# Patient Record
Sex: Female | Born: 1960 | Race: White | Hispanic: No | Marital: Single | Smoking: Never smoker
Health system: Southern US, Community
[De-identification: ages and names within clinical notes are randomized; demographics above are authoritative.]

## PROBLEM LIST (undated history)

## (undated) DIAGNOSIS — E039 Hypothyroidism, unspecified: Secondary | ICD-10-CM

## (undated) DIAGNOSIS — T8859XA Other complications of anesthesia, initial encounter: Secondary | ICD-10-CM

## (undated) DIAGNOSIS — R519 Headache, unspecified: Secondary | ICD-10-CM

## (undated) HISTORY — PX: AUGMENTATION MAMMAPLASTY: SUR837

## (undated) HISTORY — PX: OVARY SURGERY: SHX727

## (undated) HISTORY — PX: BREAST ENHANCEMENT SURGERY: SHX7

---

## 2005-01-09 ENCOUNTER — Ambulatory Visit: Payer: Self-pay | Admitting: Family Medicine

## 2005-02-20 ENCOUNTER — Ambulatory Visit: Payer: Self-pay | Admitting: Family Medicine

## 2007-04-08 ENCOUNTER — Ambulatory Visit: Payer: Self-pay | Admitting: Allergy

## 2008-02-06 ENCOUNTER — Emergency Department: Payer: Self-pay | Admitting: Emergency Medicine

## 2008-06-21 ENCOUNTER — Ambulatory Visit: Payer: Self-pay | Admitting: Physical Medicine and Rehabilitation

## 2009-03-15 ENCOUNTER — Ambulatory Visit: Payer: Self-pay | Admitting: Internal Medicine

## 2010-01-16 ENCOUNTER — Ambulatory Visit: Payer: Self-pay

## 2013-07-04 ENCOUNTER — Ambulatory Visit: Payer: Self-pay | Admitting: Family Medicine

## 2014-11-09 ENCOUNTER — Encounter: Payer: Self-pay | Admitting: Family Medicine

## 2014-11-24 ENCOUNTER — Encounter: Payer: Self-pay | Admitting: Family Medicine

## 2014-12-25 ENCOUNTER — Encounter: Payer: Self-pay | Admitting: Family Medicine

## 2015-01-23 ENCOUNTER — Encounter
Admit: 2015-01-23 | Disposition: A | Discharge status: Home / Self Care | Payer: Self-pay | Attending: Family Medicine | Admitting: Family Medicine

## 2015-02-13 IMAGING — CR DG LUMBAR SPINE COMPLETE W/ BEND
1 series · 8 of 9 positions shown · non-contrast
Comparison: none

REASON FOR EXAM: back pain
COMMENTS:

[Series 1: ap · 0.17mm/px · 8 of 9 slices shown]
[im 1/9]
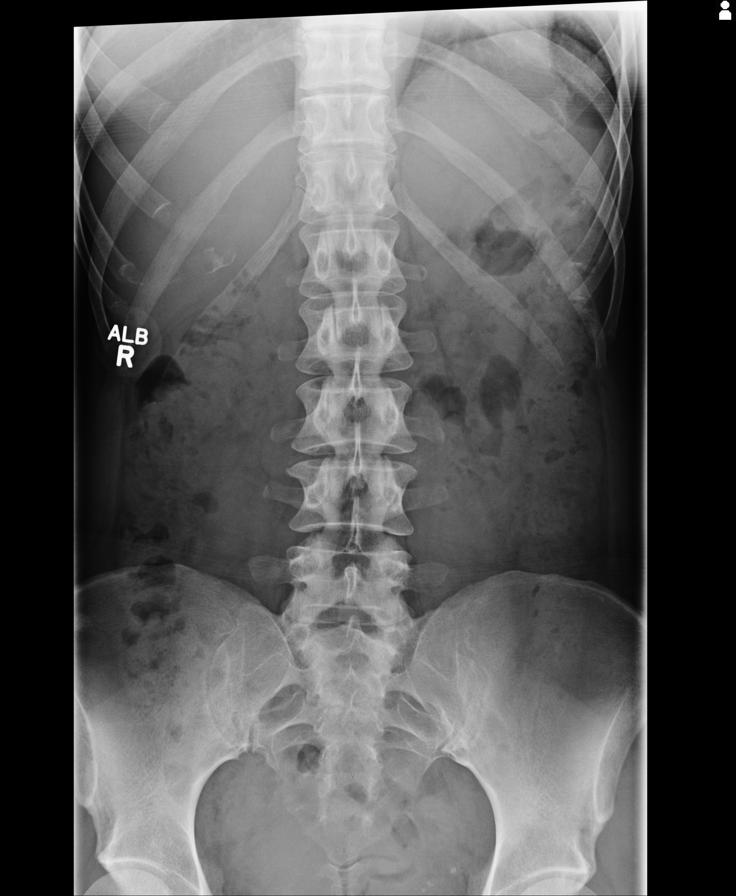
[im 2/9]
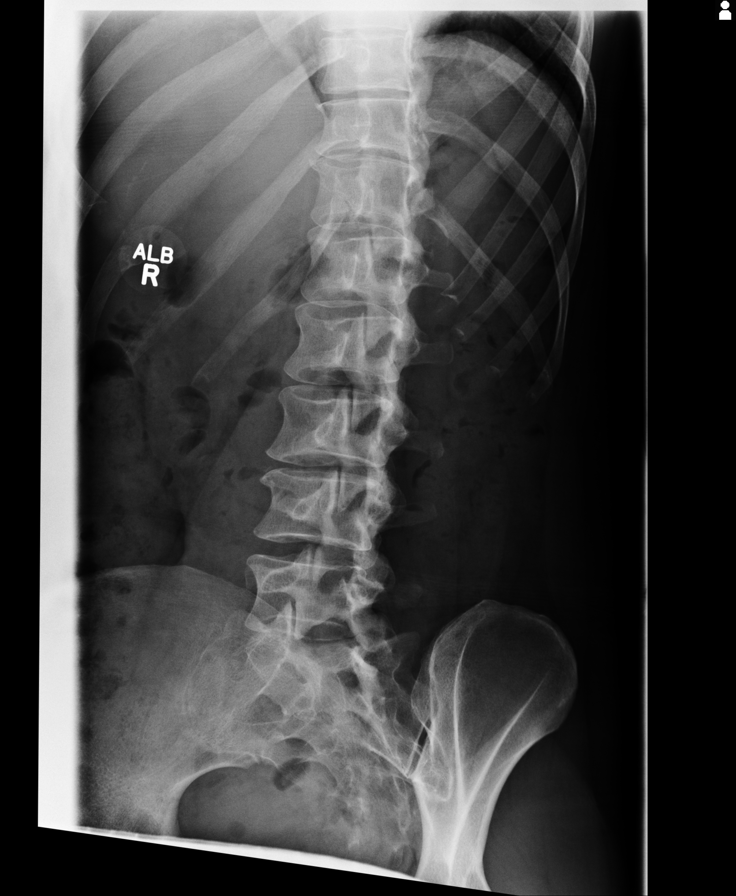
[im 3/9]
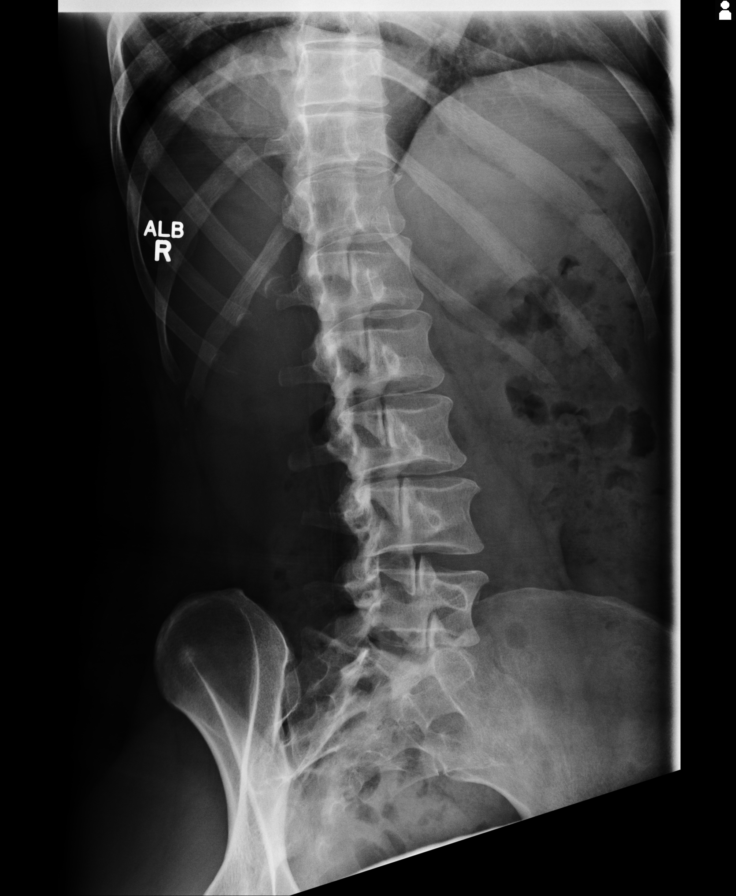
[im 4/9]
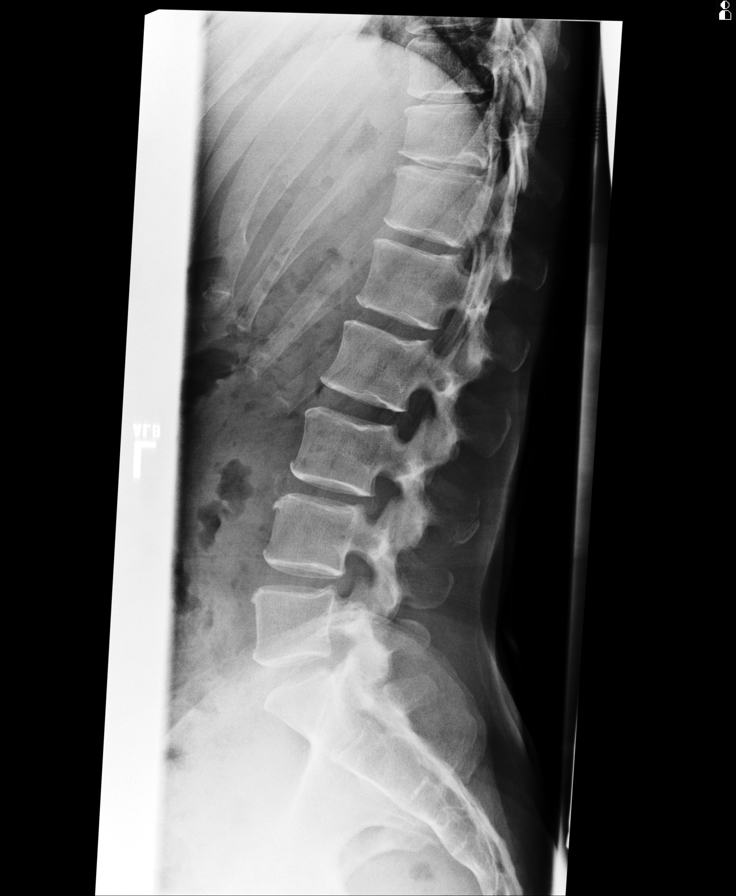
[im 5/9]
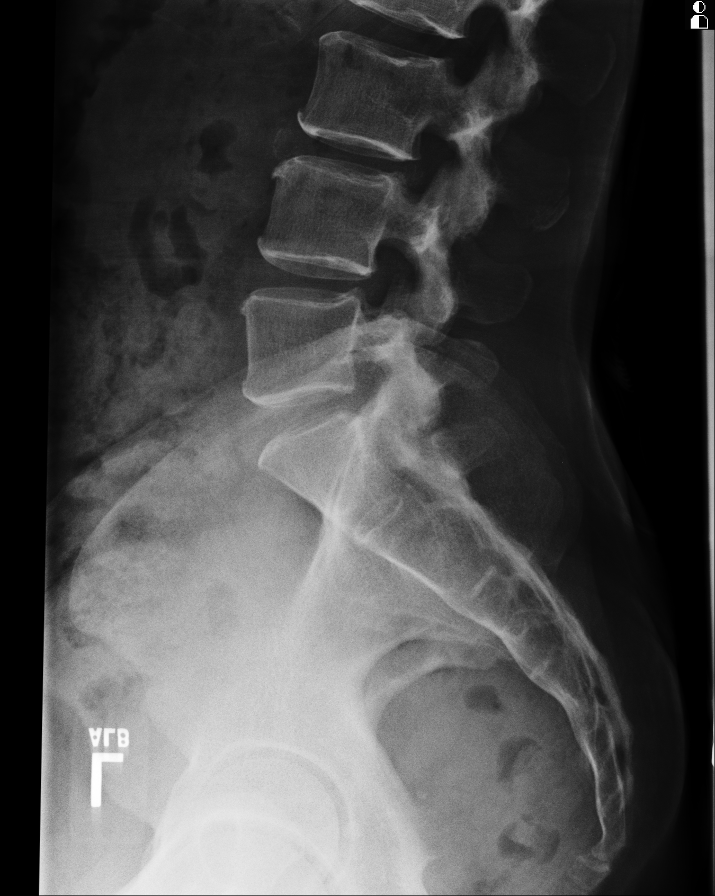
[im 6/9]
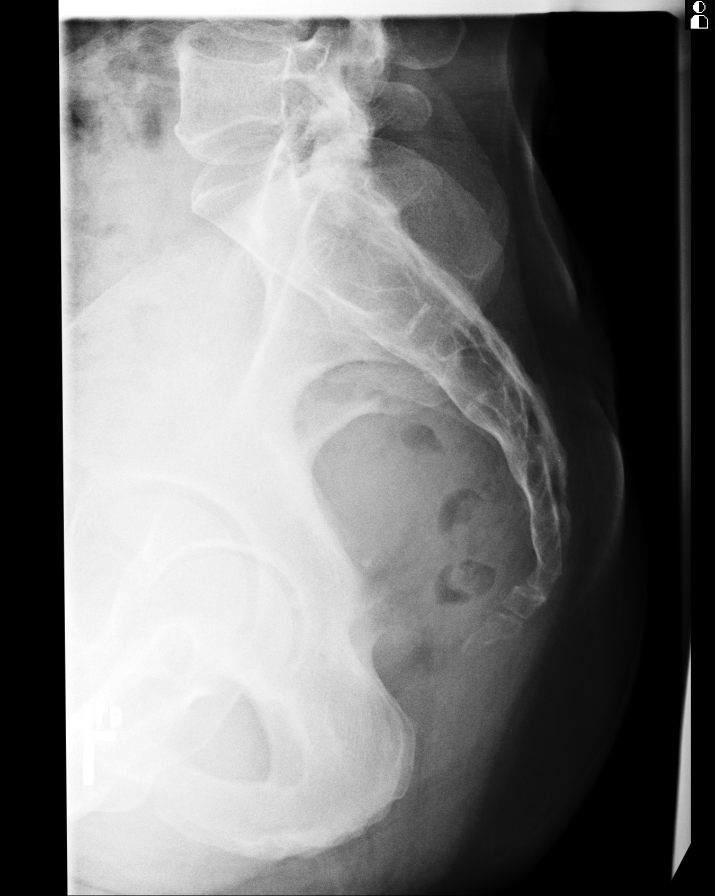
[im 7/9]
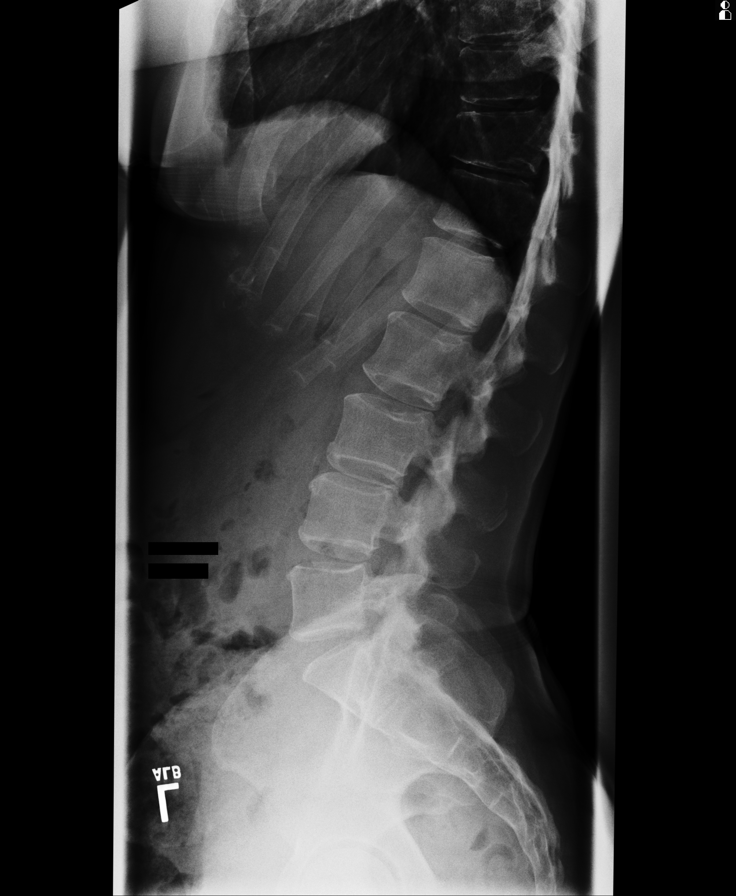
[im 8/9]
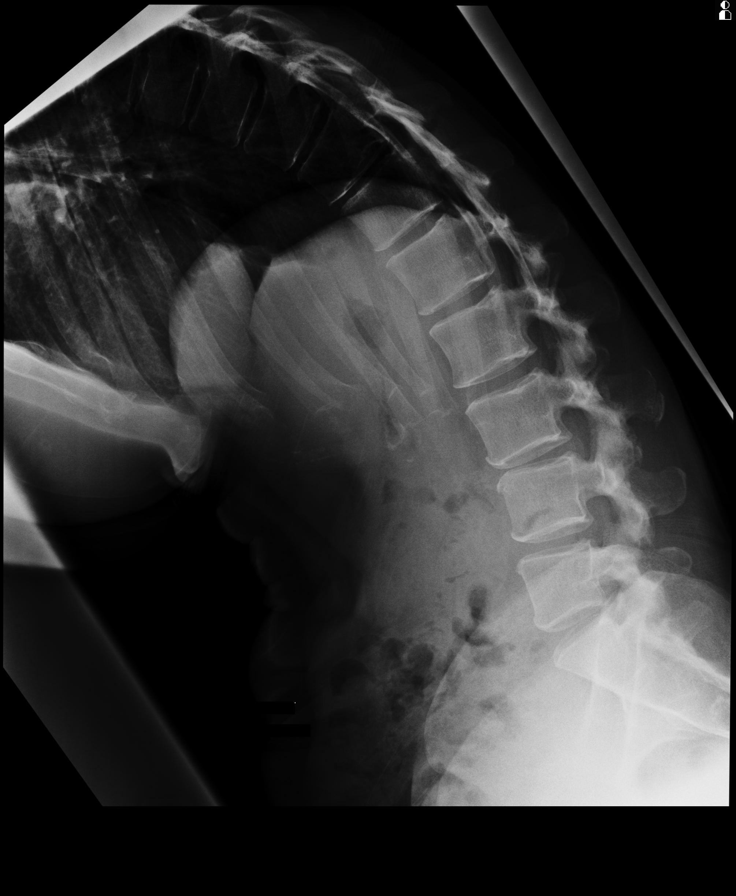

[8 of 9 positions shown; findings below may reference images not displayed]

PROCEDURE:     MDR - MDR LSP COMPLETE W/FLEX/EXTEN  - July 04, 2013 [DATE]

RESULT:     Lumbar spine alignment is maintained. The facets are normally
aligned. The vertebral body heights and intervertebral disc spaces appear to
be normal. No fracture is evident. Minimal scattered atherosclerotic
calcification appears present in the aorta. Erect neutral, flexion and
extension views of the lumbar spine do not show evidence of instability.
There appears to be good range of motion with flexion and extension.
IMPRESSION: 1. No significant degenerative disease.
2. No evidence of instability.
3. No acute bony abnormality.

[REDACTED]

## 2015-02-23 ENCOUNTER — Encounter
Admit: 2015-02-23 | Disposition: A | Discharge status: Home / Self Care | Payer: Self-pay | Attending: Family Medicine | Admitting: Family Medicine

## 2015-10-23 ENCOUNTER — Other Ambulatory Visit: Payer: Self-pay | Admitting: Obstetrics and Gynecology

## 2015-10-23 DIAGNOSIS — Z1231 Encounter for screening mammogram for malignant neoplasm of breast: Secondary | ICD-10-CM

## 2016-03-05 ENCOUNTER — Other Ambulatory Visit: Payer: Self-pay | Admitting: Obstetrics and Gynecology

## 2016-03-05 DIAGNOSIS — Z1231 Encounter for screening mammogram for malignant neoplasm of breast: Secondary | ICD-10-CM

## 2017-10-27 ENCOUNTER — Other Ambulatory Visit: Payer: Self-pay | Admitting: Obstetrics and Gynecology

## 2017-10-27 DIAGNOSIS — Z1231 Encounter for screening mammogram for malignant neoplasm of breast: Secondary | ICD-10-CM

## 2017-12-30 ENCOUNTER — Other Ambulatory Visit: Payer: Self-pay | Admitting: Family Medicine

## 2017-12-30 DIAGNOSIS — Z1231 Encounter for screening mammogram for malignant neoplasm of breast: Secondary | ICD-10-CM

## 2017-12-30 DIAGNOSIS — Z9882 Breast implant status: Secondary | ICD-10-CM

## 2018-02-24 ENCOUNTER — Inpatient Hospital Stay: Admission: RE | Admit: 2018-02-24 | Payer: Self-pay | Source: Ambulatory Visit

## 2018-03-10 ENCOUNTER — Inpatient Hospital Stay: Admission: RE | Admit: 2018-03-10 | Payer: Self-pay | Source: Ambulatory Visit

## 2018-03-22 ENCOUNTER — Inpatient Hospital Stay: Admission: RE | Admit: 2018-03-22 | Payer: Self-pay | Source: Ambulatory Visit

## 2018-11-11 ENCOUNTER — Other Ambulatory Visit: Payer: Self-pay | Admitting: Obstetrics and Gynecology

## 2018-11-11 DIAGNOSIS — Z1231 Encounter for screening mammogram for malignant neoplasm of breast: Secondary | ICD-10-CM

## 2022-01-28 ENCOUNTER — Other Ambulatory Visit: Payer: Self-pay | Admitting: Obstetrics and Gynecology

## 2022-01-28 DIAGNOSIS — Z1231 Encounter for screening mammogram for malignant neoplasm of breast: Secondary | ICD-10-CM

## 2022-04-03 ENCOUNTER — Ambulatory Visit
Admission: EM | Admit: 2022-04-03 | Discharge: 2022-04-03 | Disposition: A | Discharge status: Home / Self Care | Payer: BC Managed Care – PPO | Attending: Emergency Medicine | Admitting: Emergency Medicine

## 2022-04-03 ENCOUNTER — Encounter: Payer: Self-pay | Admitting: Emergency Medicine

## 2022-04-03 DIAGNOSIS — G43111 Migraine with aura, intractable, with status migrainosus: Secondary | ICD-10-CM | POA: Diagnosis not present

## 2022-04-03 MED ORDER — ONDANSETRON HCL 4 MG/2ML IJ SOLN
8.0000 mg | Freq: Once | INTRAMUSCULAR | Status: AC
  Administered 2022-04-03: 8 mg via INTRAVENOUS

## 2022-04-03 MED ORDER — DIPHENHYDRAMINE HCL 50 MG/ML IJ SOLN
25.0000 mg | Freq: Once | INTRAMUSCULAR | Status: AC
  Administered 2022-04-03: 25 mg via INTRAVENOUS

## 2022-04-03 MED ORDER — METHYLPREDNISOLONE 4 MG PO TBPK: ORAL_TABLET | ORAL | 0 refills | Status: DC

## 2022-04-03 MED ORDER — KETOROLAC TROMETHAMINE 30 MG/ML IJ SOLN
30.0000 mg | Freq: Once | INTRAMUSCULAR | Status: AC
  Administered 2022-04-03: 30 mg via INTRAVENOUS

## 2022-04-03 MED ORDER — SODIUM CHLORIDE 0.9 % IV BOLUS
1000.0000 mL | Freq: Once | INTRAVENOUS | Status: AC
  Administered 2022-04-03: 1000 mL via INTRAVENOUS

## 2022-04-03 NOTE — ED Provider Notes (Signed)
?[facility] ? ? ? ?CSN: [id number] ?Arrival date & time: 04/03/22  0803 ? ? ?  ? ?History   ?Chief Complaint ?Chief Complaint  ?Patient presents with  ? Migraine  ? Emesis  ? ? ?HPI ?Michele Ritter is a 61 y.o. female.  ? ?HPI ? ?61 year old female here for evaluation of migraine headache. ? ?Patient reports that she has been experiencing her current migraine for the last 4 days.  She states that for the first 2 days was on the right side of her scalp and then switched to the left side.  She states that this is typical pattern for her migraine.  She occasionally experiences an aura and she had 1 this morning but has not had 1 in the previous 3 days.  She is also complaining of dizziness, which sometimes also accompanies her migraines.  She denies any changes in vision or numbness and tingling in her extremities.  She did begin vomiting this morning and has had several episodes of emesis.  She had been seen by Dr. Lennette Bihari at the [facility] but she is no longer going there.  She has experimented with 2 rounds of Botox for her migraines as well as lidocaine and steroid injections that were unsuccessful.  She states that she is never been in the ER and she is unaware of getting steroids for her migraines as she has an intolerance because they cause tachycardia.  Patient reports that her migraine headache cycle is 3 days on followed by 3-day break and then another 3 days of headache.  This has been a cycle that has been ongoing for a long while.  Sometimes her headaches last 4 days but that is unusual. ? ?History reviewed. No pertinent past medical history. ? ?There are no problems to display for this patient. ? ? ?History reviewed. No pertinent surgical history. ? ?OB History   ?No obstetric history on file. ?  ? ? ? ?Home Medications   ? ?Prior to Admission medications   ?Medication Sig Start Date End Date Taking? Authorizing Provider  ?methylPREDNISolone (MEDROL DOSEPAK) 4 MG TBPK tablet Take  according to the package insert. 04/03/22  Yes Becky Augusta, NP  ? ? ?Family History ?History reviewed. No pertinent family history. ? ?Social History ?Social History  ? ?Tobacco Use  ? Smoking status: Never  ? Smokeless tobacco: Never  ? ? ? ?Allergies   ?Codeine and Dust mite extract ? ? ?Review of Systems ?Review of Systems  ?Eyes:  Negative for visual disturbance.  ?Gastrointestinal:  Positive for nausea and vomiting.  ?Neurological:  Positive for dizziness and headaches. Negative for syncope and facial asymmetry.  ?Hematological: Negative.   ?Psychiatric/Behavioral: Negative.    ? ? ?Physical Exam ?Triage Vital Signs ?ED Triage Vitals  ?Enc Vitals Group  ?   BP 04/03/22 0816 (!) 154/90  ?   Pulse Rate 04/03/22 0816 61  ?   Resp 04/03/22 0816 16  ?   Temp 04/03/22 0816 97.7 ?F (36.5 ?C)  ?   Temp Source 04/03/22 0816 Oral  ?   SpO2 04/03/22 0816 100 %  ?   Weight --   ?   Height --   ?   Head Circumference --   ?   Peak Flow --   ?   Pain Score 04/03/22 0813 10  ?   Pain Loc --   ?   Pain Edu? --   ?   Excl. in GC? --   ? ?  No data found. ? ?Updated Vital Signs ?BP (!) 154/90 (BP Location: Right Arm)   Pulse 61   Temp 97.7 ?F (36.5 ?C) (Oral)   Resp 16   SpO2 100%  ? ?Visual Acuity ?Right Eye Distance:   ?Left Eye Distance:   ?Bilateral Distance:   ? ?Right Eye Near:   ?Left Eye Near:    ?Bilateral Near:    ? ?Physical Exam ?Vitals and nursing note reviewed.  ?Constitutional:   ?   General: She is in acute distress.  ?   Appearance: Normal appearance.  ?HENT:  ?   Head: Normocephalic and atraumatic.  ?   Mouth/Throat:  ?   Mouth: Mucous membranes are moist.  ?   Pharynx: Oropharynx is clear. No oropharyngeal exudate or posterior oropharyngeal erythema.  ?Eyes:  ?   General:     ?   Right eye: No discharge.     ?   Left eye: No discharge.  ?   Extraocular Movements: Extraocular movements intact.  ?   Conjunctiva/sclera: Conjunctivae normal.  ?   Pupils: Pupils are equal, round, and reactive to light.   ?Musculoskeletal:  ?   Cervical back: Normal range of motion and neck supple. Tenderness present.  ?Skin: ?   General: Skin is warm and dry.  ?   Capillary Refill: Capillary refill takes less than 2 seconds.  ?   Findings: No erythema or rash.  ?Neurological:  ?   General: No focal deficit present.  ?   Mental Status: She is alert and oriented to person, place, and time.  ?   Cranial Nerves: No cranial nerve deficit.  ?   Sensory: No sensory deficit.  ?   Motor: No weakness.  ?Psychiatric:     ?   Mood and Affect: Mood normal.     ?   Behavior: Behavior normal.     ?   Thought Content: Thought content normal.     ?   Judgment: Judgment normal.  ? ? ? ?UC Treatments / Results  ?Labs ?(all labs ordered are listed, but only abnormal results are displayed) ?Labs Reviewed - No data to display ? ?EKG ? ? ?Radiology ?No results found. ? ?Procedures ?Procedures (including critical care time) ? ?Medications Ordered in UC ?Medications  ?sodium chloride 0.9 % bolus 1,000 mL (1,000 mLs Intravenous New Bag/Given 04/03/22 0851)  ?ondansetron [facility]) injection 8 mg (8 mg Intravenous Given 04/03/22 0852)  ?diphenhydrAMINE (BENADRYL) injection 25 mg (25 mg Intravenous Given 04/03/22 0851)  ?ketorolac (TORADOL) 30 MG/ML injection 30 mg (30 mg Intravenous Given 04/03/22 0852)  ? ? ?Initial Impression / Assessment and Plan / UC Course  ?I have reviewed the triage vital signs and the nursing notes. ? ?Pertinent labs & imaging results that were available during my care of the patient were reviewed by me and considered in my medical decision making (see chart for details). ? ?Patient is a pleasant 61 year old female who does appear to be in a great deal of pain presenting for evaluation of migraine this lasted for last 4 days.  The relevant history is reviewed and the HPI above.  On exam patient's cranial nerves II through XII are intact.  Pupils equal round reactive and EOMs intact.  No nystagmus or strabismus noted.  No facial asymmetry.   Patient's grips, upper extremity strength, and lower EXTR strength are 5/5.  Patient has used sumatriptan and naratriptan injections without relief.  I have advised the patient that the best medicine  to break migraine is magnesium, followed by caffeine, but neither 1 are available to us in the clinic.  We will trial an IV fluid bolus of normal saline, IV Benadryl, IV Toradol, and IV Zofran to see if patient gets any relief.  If there is no relief of her migraine I have discussed options of going home on a low-dose steroid pack or going to the emergency department for further management. ? ?Following IV fluid bolus and IV medication patient rates her current headache pain is a 5.5/10.  This is a mild improvement from the 8/10 at the outset.  I have given the patient a choice of 2 possibilities, the first being go home with a steroid pack and some oral supplements.  The second being go to the ER for additional IV fluids, medication and possible magnesium administration.  She has elected to go home with a steroid pack.  I will discharge her home on a Medrol Dosepak.  I will also suggest that she start using co-Q10 3 to 4 mg daily, vitamin B2 2 mg twice daily, and magnesium threonate 4 mg daily to help with her migraines. ? ? ?Final Clinical Impressions(s) / UC Diagnoses  ? ?Final diagnoses:  ?Intractable migraine with aura with status migrainosus  ? ? ? ?Discharge Instructions   ? ?  ?Go home and rest in a cool dark environment and start taking the Medrol Dosepak according to the package instructions. ? ?It is important to maintain hydration some Ensure that you are taking frequent sips of fluid so that she do not get dehydrated as this will exacerbate your migraine headache. ? ?You may also consider the following supplements for your headache as they play a role in inflammation and muscle tone within the blood vessels in your body and brain: ?Co-Q10 300-400 mg daily ?Vitamin B2 200 mg twice daily ?Magnesium threonate  400 mg daily ? ?If your headache does not improve, or worsens, I would recommend going to the emergency department for further evaluation.  You may want to also contact [facility]

## 2022-04-03 NOTE — Discharge Instructions (Addendum)
Go home and rest in a cool dark environment and start taking the Medrol Dosepak according to the package instructions. ? ?It is important to maintain hydration some Ensure that you are taking frequent sips of fluid so that she do not get dehydrated as this will exacerbate your migraine headache. ? ?You may also consider the following supplements for your headache as they play a role in inflammation and muscle tone within the blood vessels in your body and brain: ?Co-Q10 300-400 mg daily ?Vitamin B2 200 mg twice daily ?Magnesium threonate 400 mg daily ? ?If your headache does not improve, or worsens, I would recommend going to the emergency department for further evaluation.  You may want to also contact Dr. Park Breed at the [facility] for a reevaluation to see if there is any further treatment you could be offered. ?

## 2022-04-03 NOTE — ED Triage Notes (Addendum)
Pt reports a migraine since Monday that has gotten progressively worse over the last 24 hours. Also reports episodes of emesis beginning this morning. States took a shot yesterday and reports taking Sumatriptan and Naratriptan with no relief. ?

## 2022-11-25 ENCOUNTER — Encounter: Payer: Self-pay | Admitting: Otolaryngology

## 2022-11-26 ENCOUNTER — Encounter: Payer: Self-pay | Admitting: General Practice

## 2022-11-27 NOTE — Discharge Instructions (Signed)
[facility] ENDOSCOPIC SINUS SURGERY [facility]  What is Functional Endoscopic Sinus Surgery?  The Surgery involves making the natural openings of the sinuses larger by removing the bony partitions that separate the sinuses from the nasal cavity.  The natural sinus lining is preserved as much as possible to allow the sinuses to resume normal function after the surgery.  In some patients nasal polyps (excessively swollen lining of the sinuses) may be removed to relieve obstruction of the sinus openings.  The surgery is performed through the nose using lighted scopes, which eliminates the need for incisions on the face.  A septoplasty is a different procedure which is sometimes performed with sinus surgery.  It involves straightening the boy partition that separates the two sides of your nose.  A crooked or deviated septum may need repair if is obstructing the sinuses or nasal airflow.  Turbinate reduction is also often performed during sinus surgery.  The turbinates are bony proturberances from the side walls of the nose which swell and can obstruct the nose in patients with sinus and allergy problems.  Their size can be surgically reduced to help relieve nasal obstruction.  What Can Sinus Surgery Do For Me?  Sinus surgery can reduce the frequency of sinus infections requiring antibiotic treatment.  This can provide improvement in nasal congestion, post-nasal drainage, facial pressure and nasal obstruction.  Surgery will NOT prevent you from ever having an infection again, so it usually only for patients who get infections 4 or more times yearly requiring antibiotics, or for infections that do not clear with antibiotics.  It will not cure nasal allergies, so patients with allergies may still require medication to treat their allergies after surgery. Surgery may improve headaches related to sinusitis, however, some people will continue to  require medication to control sinus headaches related to allergies.  Surgery will do nothing for other forms of headache (migraine, tension or cluster).  What Are the Risks of Endoscopic Sinus Surgery?  Current techniques allow surgery to be performed safely with little risk, however, there are rare complications that patients should be aware of.  Because the sinuses are located around the eyes, there is risk of eye injury, including blindness, though again, this would be quite rare. This is usually a result of bleeding behind the eye during surgery, which can effect vision, though there are treatments to protect the vision and prevent permanent injury. More serious complications would include bleeding inside the brain cavity or damage to the brain.This happens when the fluid around the brain leaks out into the sinus cavity.  Again, all of these complications are uncommon, and spinal fluid leaks can be safely managed surgically if they occur.  The most common complication of sinus surgery is bleeding from the nose, which may require packing or cauterization of the nose.  Patients with polyps may experience recurrence of the polyps that would require revision surgery.  Alterations of sense of smell or injury to the tear ducts are also rare complications.   What is the Surgery Like, and what is the Recovery?  The Surgery usually takes a couple of hours to perform, and is usually performed under a general anesthetic (completely asleep).  Patients are usually discharged home after a couple of hours.  Sometimes during surgery it is necessary to pack the nose to control bleeding, and the packing is left in place for 24 - 48 hours, and removed by your surgeon.  If   a septoplasty was performed during the procedure, there is often a splint placed which must be removed after 5-7 days.   Discomfort: Pain is usually mild to moderate, and can be controlled by prescription pain medication or acetaminophen (Tylenol).   Aspirin, Ibuprofen (Advil, Motrin), or Naprosyn (Aleve) should be avoided, as they can cause increased bleeding.  Most patients feel sinus pressure like they have a bad head cold for several days.  Sleeping with your head elevated can help reduce swelling and facial pressure, as can ice packs over the face.  A humidifier may be helpful to keep the mucous and blood from drying in the nose.   Diet: There are no specific diet restrictions, however, you should generally start with clear liquids and a light diet of bland foods because the anesthetic can cause some nausea.  Advance your diet depending on how your stomach feels.  Taking your pain medication with food will often help reduce stomach upset which pain medications can cause.  Nasal Saline Irrigation: It is important to remove blood clots and dried mucous from the nose as it is healing.  This is done by having you irrigate the nose at least 3 - 4 times daily with a salt water solution.  We recommend using NeilMed Sinus Rinse (available at the drug store).  Fill the squeeze bottle with the solution, bend over a sink, and insert the tip of the squeeze bottle into the nose  of an inch.  Point the tip of the squeeze bottle towards the inside corner of the eye on the same side your irrigating.  Squeeze the bottle and gently irrigate the nose.  If you bend forward as you do this, most of the fluid will flow back out of the nose, instead of down your throat.   The solution should be warm, near body temperature, when you irrigate.   Each time you irrigate, you should use a full squeeze bottle.   Note that if you are instructed to use Nasal Steroid Sprays at any time after your surgery, irrigate with saline BEFORE using the steroid spray, so you do not wash it all out of the nose. Another product, Nasal Saline Gel (such as AYR Nasal Saline Gel) can be applied in each nostril 3 - 4 times daily to moisture the nose and reduce scabbing or crusting.  Bleeding:   Bloody drainage from the nose can be expected for several days, and patients are instructed to irrigate their nose frequently with salt water to help remove mucous and blood clots.  The drainage may be dark red or brown, though some fresh blood may be seen intermittently, especially after irrigation.  Do not blow you nose, as bleeding may occur. If you must sneeze, keep your mouth open to allow air to escape through your mouth.  If heavy bleeding occurs: Irrigate the nose with saline to rinse out clots, then spray the nose 3 - 4 times with Afrin Nasal Decongestant Spray.  The spray will constrict the blood vessels to slow bleeding.  Pinch the lower half of your nose shut to apply pressure, and lay down with your head elevated.  Ice packs over the nose may help as well. If bleeding persists despite these measures, you should notify your doctor.  Do not use the Afrin routinely to control nasal congestion after surgery, as it can result in worsening congestion and may affect healing.     Activity: Return to work varies among patients. Most patients will be out   of work at least 5 - 7 days to recover.  Patient may return to work after they are off of narcotic pain medication, and feeling well enough to perform the functions of their job.  Patients must avoid heavy lifting (over 10 pounds) or strenuous physical for 2 weeks after surgery, so your employer may need to assign you to light duty, or keep you out of work longer if light duty is not possible.  NOTE: you should not drive, operate dangerous machinery, do any mentally demanding tasks or make any important legal or financial decisions while on narcotic pain medication and recovering from the general anesthetic.    Call Your Doctor Immediately if You Have Any of the Following: Bleeding that you cannot control with the above measures Loss of vision, double vision, bulging of the eye or black eyes. Fever over 101 degrees Neck stiffness with severe headache,  fever, nausea and change in mental state. You are always encouraged to call anytime with concerns, however, please call with requests for pain medication refills during office hours.  Office Endoscopy: During follow-up visits your doctor will remove any packing or splints that may have been placed and evaluate and clean your sinuses endoscopically.  Topical anesthetic will be used to make this as comfortable as possible, though you may want to take your pain medication prior to the visit.  How often this will need to be done varies from patient to patient.  After complete recovery from the surgery, you may need follow-up endoscopy from time to time, particularly if there is concern of recurrent infection or nasal polyps.  

## 2022-12-04 ENCOUNTER — Ambulatory Visit: Admit: 2022-12-04 | Payer: BC Managed Care – PPO | Admitting: Otolaryngology

## 2022-12-04 HISTORY — DX: Headache, unspecified: R51.9

## 2022-12-04 HISTORY — DX: Other complications of anesthesia, initial encounter: T88.59XA

## 2022-12-04 HISTORY — DX: Hypothyroidism, unspecified: E03.9

## 2022-12-04 SURGERY — SEPTOPLASTY, NOSE
Anesthesia: General | Laterality: Right

## 2022-12-24 ENCOUNTER — Other Ambulatory Visit: Payer: Self-pay | Admitting: Obstetrics and Gynecology

## 2022-12-24 DIAGNOSIS — Z13820 Encounter for screening for osteoporosis: Secondary | ICD-10-CM

## 2023-03-09 ENCOUNTER — Other Ambulatory Visit: Payer: BC Managed Care – PPO

## 2023-03-12 ENCOUNTER — Ambulatory Visit
Admission: RE | Admit: 2023-03-12 | Discharge: 2023-03-12 | Disposition: A | Discharge status: Home / Self Care | Payer: BC Managed Care – PPO | Source: Ambulatory Visit | Attending: Obstetrics and Gynecology | Admitting: Obstetrics and Gynecology

## 2023-03-12 DIAGNOSIS — Z13820 Encounter for screening for osteoporosis: Secondary | ICD-10-CM | POA: Diagnosis not present

## 2023-05-12 ENCOUNTER — Other Ambulatory Visit: Payer: Self-pay | Admitting: Obstetrics and Gynecology

## 2023-05-12 DIAGNOSIS — Z1231 Encounter for screening mammogram for malignant neoplasm of breast: Secondary | ICD-10-CM

## 2023-06-09 ENCOUNTER — Inpatient Hospital Stay: Admission: RE | Admit: 2023-06-09 | Payer: BC Managed Care – PPO | Source: Ambulatory Visit

## 2023-07-02 ENCOUNTER — Inpatient Hospital Stay: Admission: RE | Admit: 2023-07-02 | Payer: BC Managed Care – PPO | Source: Ambulatory Visit

## 2023-07-29 ENCOUNTER — Ambulatory Visit
Admission: RE | Admit: 2023-07-29 | Discharge: 2023-07-29 | Disposition: A | Discharge status: Home / Self Care | Payer: BC Managed Care – PPO | Source: Ambulatory Visit | Attending: Obstetrics and Gynecology | Admitting: Obstetrics and Gynecology

## 2023-07-29 DIAGNOSIS — Z1231 Encounter for screening mammogram for malignant neoplasm of breast: Secondary | ICD-10-CM | POA: Diagnosis present

## 2024-07-07 ENCOUNTER — Other Ambulatory Visit: Payer: Self-pay | Admitting: Obstetrics and Gynecology

## 2024-07-07 DIAGNOSIS — Z1231 Encounter for screening mammogram for malignant neoplasm of breast: Secondary | ICD-10-CM

## 2024-09-10 ENCOUNTER — Ambulatory Visit
Admission: EM | Admit: 2024-09-10 | Discharge: 2024-09-10 | Disposition: A | Discharge status: Home / Self Care | Attending: Emergency Medicine | Admitting: Emergency Medicine

## 2024-09-10 ENCOUNTER — Encounter: Payer: Self-pay | Admitting: Emergency Medicine

## 2024-09-10 DIAGNOSIS — G43E11 Chronic migraine with aura, intractable, with status migrainosus: Secondary | ICD-10-CM

## 2024-09-10 MED ORDER — METOCLOPRAMIDE HCL 5 MG/ML IJ SOLN
10.0000 mg | Freq: Once | INTRAMUSCULAR | Status: AC
  Administered 2024-09-10: 10 mg via INTRAMUSCULAR

## 2024-09-10 MED ORDER — KETOROLAC TROMETHAMINE 30 MG/ML IJ SOLN
30.0000 mg | Freq: Once | INTRAMUSCULAR | Status: AC
  Administered 2024-09-10: 30 mg via INTRAMUSCULAR

## 2024-09-10 MED ORDER — DEXAMETHASONE SOD PHOSPHATE PF 10 MG/ML IJ SOLN
10.0000 mg | Freq: Once | INTRAMUSCULAR | Status: AC
  Administered 2024-09-10: 10 mg via INTRAMUSCULAR

## 2024-09-10 MED ORDER — METHYLPREDNISOLONE 4 MG PO TBPK: ORAL_TABLET | ORAL | 0 refills | Status: AC

## 2024-09-10 NOTE — ED Provider Notes (Signed)
 [facility]    CSN: [id number] Arrival date & time: 09/10/24  0803      History   Chief Complaint Chief Complaint  Patient presents with   Migraine    HPI Michele Ritter is a 63 y.o. female.   HPI  63 year old female with past medical history significant for chronic intractable migraine headaches and hypothyroidism presents for evaluation of 2 days worth of a migraine.  She reports that the migraine is behind her left eye and on the left side of her head.  She has uncontrollable yawning as an aura as well as flashing lights.  She has been experiencing nausea but no vomiting.  She denies any numbness, tingling, or weakness in any of her extremities.  She reports that this is a typical pattern for her migraines.  She has sumatriptan at home for an abortive but she has not taken it in the last 24 hours as it had not been effective.  Several weeks ago her PCP administered Toradol , Phenergan, and a steroid injection to abate a similar headache.    Past Medical History:  Diagnosis Date   Complication of anesthesia    Headache    Hypothyroidism     There are no active problems to display for this patient.   Past Surgical History:  Procedure Laterality Date   AUGMENTATION MAMMAPLASTY     BREAST ENHANCEMENT SURGERY Bilateral    OVARY SURGERY Left     OB History   No obstetric history on file.      Home Medications    Prior to Admission medications   Medication Sig Start Date End Date Taking? Authorizing Provider  levothyroxine (SYNTHROID) 25 MCG tablet Take 25 mcg by mouth daily before breakfast.    [provider]  methylPREDNISolone  (MEDROL  DOSEPAK) 4 MG TBPK tablet Take according to the package insert. 09/10/24   Bernardino Ditch, NP  naltrexone (DEPADE) 50 MG tablet Take 1.5 mg by mouth daily.    [provider]  SUMAtriptan (IMITREX) 100 MG tablet Take 100 mg by mouth every 2 (two) hours as needed for migraine. May repeat in 2 hours if  headache persists or recurs.    [provider]    Family History History reviewed. No pertinent family history.  Social History Social History   Tobacco Use   Smoking status: Never   Smokeless tobacco: Never     Allergies   Codeine, Dust mite extract, and Ciprofloxacin   Review of Systems Review of Systems  Eyes:  Positive for photophobia and visual disturbance.       Flashing lights.  Gastrointestinal:  Positive for nausea. Negative for vomiting.  Neurological:  Positive for headaches. Negative for dizziness, weakness and numbness.     Physical Exam Triage Vital Signs ED Triage Vitals  Encounter Vitals Group     BP      Girls Systolic BP Percentile      Girls Diastolic BP Percentile      Boys Systolic BP Percentile      Boys Diastolic BP Percentile      Pulse      Resp      Temp      Temp src      SpO2      Weight      Height      Head Circumference      Peak Flow      Pain Score      Pain Loc  Pain Education      Exclude from Growth Chart    No data found.  Updated Vital Signs BP (!) 172/71 (BP Location: Right Arm)   Pulse 66   Temp 97.8 F (36.6 C) (Oral)   SpO2 97%   Visual Acuity Right Eye Distance:   Left Eye Distance:   Bilateral Distance:    Right Eye Near:   Left Eye Near:    Bilateral Near:     Physical Exam Vitals and nursing note reviewed.  Constitutional:      General: She is in acute distress.     Comments: Patient appears to be in a significant degree of pain.  Eyes:     General: No scleral icterus.       Right eye: No discharge.        Left eye: No discharge.     Extraocular Movements: Extraocular movements intact.     Conjunctiva/sclera: Conjunctivae normal.     Pupils: Pupils are equal, round, and reactive to light.  Skin:    General: Skin is warm and dry.     Capillary Refill: Capillary refill takes less than 2 seconds.  Neurological:     General: No focal deficit present.     Mental Status: She is  alert and oriented to person, place, and time.      UC Treatments / Results  Labs (all labs ordered are listed, but only abnormal results are displayed) Labs Reviewed - No data to display  EKG   Radiology No results found.  Procedures Procedures (including critical care time)  Medications Ordered in UC Medications  ketorolac  (TORADOL ) 30 MG/ML injection 30 mg (30 mg Intramuscular Given 09/10/24 0850)  metoCLOPramide (REGLAN) injection 10 mg (10 mg Intramuscular Given 09/10/24 0852)  dexamethasone (DECADRON) injection 10 mg (10 mg Intramuscular Given 09/10/24 0848)    Initial Impression / Assessment and Plan / UC Course  I have reviewed the triage vital signs and the nursing notes.  Pertinent labs & imaging results that were available during my care of the patient were reviewed by me and considered in my medical decision making (see chart for details).   Patient is a pleasant 63 year old female who presents for evaluation of migraine headache that has been significant for the last 48 hours.  She reports that she has been unable to sleep secondary to the pain.  It is her typical pattern and resides behind her left eye and on the left side of her head.  She is rating it as a 10/10 currently.  No numbness, tingling, weakness in her extremities.  She had been going to Dr. Franky Sprinkles at [facility] but reports that she no longer sees him.  She has an upcoming appointment with [facility] neurology.  Several weeks ago she received an injection of Toradol , Phenergan, and steroid for a similar presentation by her PCP.  She reports that she has never had to receive an IV infusion of magnesium, caffeine, or ergotamine to break her migraine.  I advised the patient that we do not have Phenergan here in clinic but I would be happy to order a commendation of 30 mg of Toradol , 10 mg of Reglan, and 10 mg of Decadron IM to see if this abates the patient's headache.  If her headache does  not improve I will refer her to the emergency department.  Following the migraine cocktail the patient reports that her pain has greatly improved it is now a 4/10.  Typically,  she is prescribed a Medrol  Dosepak following IM steroids to help with her migraine headaches.  I will send a prescription to [facility] for her to start tomorrow.  I have encouraged her to keep her follow-up appointment with [facility] neurology as previously scheduled.  She is already taking vitamin B2, magnesium, and co-Q10 for management of her migraines.   Final Clinical Impressions(s) / UC Diagnoses   Final diagnoses:  Intractable chronic migraine with aura with status migrainosus     Discharge Instructions      Resume your normal migraine regimen at home.  I have prescribed a Medrol  Dosepak to help keep the inflammation in your brain decreased so as to help prevent a return of your migraine headache.  You can start this tomorrow morning.  Keep your follow-up appointment with neurology as previously scheduled.     ED Prescriptions     Medication Sig Dispense Auth. Provider   methylPREDNISolone  (MEDROL  DOSEPAK) 4 MG TBPK tablet Take according to the package insert. 1 each Bernardino Ditch, NP      PDMP not reviewed this encounter.   Bernardino Ditch, NP 09/10/24 [phone]

## 2024-09-10 NOTE — ED Triage Notes (Signed)
 Pt c/o migraine onset 2 days ago. Had 3 injections Wednesday before last. Pt is sensitive to sound. Unable to sleep due to pain.

## 2024-09-10 NOTE — Discharge Instructions (Addendum)
 Resume your normal migraine regimen at home.  I have prescribed a Medrol  Dosepak to help keep the inflammation in your brain decreased so as to help prevent a return of your migraine headache.  You can start this tomorrow morning.  Keep your follow-up appointment with neurology as previously scheduled.
